# Patient Record
Sex: Male | Born: 2000 | Race: White | Hispanic: No | Marital: Single | Smoking: Never smoker
Health system: Southern US, Community
[De-identification: ages and names within clinical notes are randomized; demographics above are authoritative.]

## PROBLEM LIST (undated history)

## (undated) DIAGNOSIS — F909 Attention-deficit hyperactivity disorder, unspecified type: Secondary | ICD-10-CM

## (undated) DIAGNOSIS — T07XXXA Unspecified multiple injuries, initial encounter: Secondary | ICD-10-CM

## (undated) DIAGNOSIS — S82209A Unspecified fracture of shaft of unspecified tibia, initial encounter for closed fracture: Secondary | ICD-10-CM

---
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
## 2022-05-30 DIAGNOSIS — L0501 Pilonidal cyst with abscess: Secondary | ICD-10-CM | POA: Diagnosis not present

## 2022-06-24 DIAGNOSIS — F4322 Adjustment disorder with anxiety: Secondary | ICD-10-CM | POA: Diagnosis not present

## 2022-07-10 DIAGNOSIS — F4322 Adjustment disorder with anxiety: Secondary | ICD-10-CM | POA: Diagnosis not present

## 2022-07-17 DIAGNOSIS — F4322 Adjustment disorder with anxiety: Secondary | ICD-10-CM | POA: Diagnosis not present

## 2022-10-15 ENCOUNTER — Encounter: Payer: Self-pay | Admitting: Psychiatry

## 2022-10-15 ENCOUNTER — Ambulatory Visit (INDEPENDENT_AMBULATORY_CARE_PROVIDER_SITE_OTHER): Payer: BC Managed Care – PPO | Admitting: Psychiatry

## 2022-10-15 VITALS — BP 116/69 | HR 63 | Ht 66.0 in | Wt 171.0 lb

## 2022-10-15 DIAGNOSIS — F900 Attention-deficit hyperactivity disorder, predominantly inattentive type: Secondary | ICD-10-CM

## 2022-10-15 MED ORDER — AMPHETAMINE-DEXTROAMPHET ER 10 MG PO CP24: 10.0000 mg | ORAL_CAPSULE | Freq: Every day | ORAL | 0 refills | Status: DC
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
## 2022-10-16 ENCOUNTER — Ambulatory Visit: Payer: BC Managed Care – PPO | Admitting: Psychiatry

## 2022-11-17 ENCOUNTER — Ambulatory Visit (INDEPENDENT_AMBULATORY_CARE_PROVIDER_SITE_OTHER): Payer: BC Managed Care – PPO | Admitting: Psychiatry

## 2022-11-17 ENCOUNTER — Encounter: Payer: Self-pay | Admitting: Psychiatry

## 2022-11-17 DIAGNOSIS — F900 Attention-deficit hyperactivity disorder, predominantly inattentive type: Secondary | ICD-10-CM | POA: Diagnosis not present

## 2022-11-17 MED ORDER — AMPHETAMINE-DEXTROAMPHET ER 10 MG PO CP24: 10.0000 mg | ORAL_CAPSULE | ORAL | 0 refills | Status: DC

## 2022-11-17 NOTE — Progress Notes (Signed)
Walter Fox, Walter Fox   Follow-up visit  Date of Service: 11/17/2022  CC/Purpose: Routine medication management follow up.    Walter Fox is a 22 y.o. male with a past psychiatric history of ADHD who presents today for a psychiatric follow up appointment.     The patient was last seen on 10/15/22, at which time the following plan was established:  Medication management:             - Start Adderall XR '10mg'$  daily for ADHD _______________________________________________________________________________________ Acute events/encounters since last visit: denies    Walter Fox presents alone for his appointment. On evaluation he reports that things have been okay since his last visit. He has been taking the Adderall fairly regularly. He feels that the current dose seems to be helping - he notices this most when he misses the dose. His appetite remains fairly stable despite taking this, no other side effects.  He would like to stay on the current dose  He does report some possible depression. He feels down at times, finds his mood is easily impacted by things around him. He still gets interested and excited about things, but this appears diminished some as well. He has low energy, deals with a lot of financial and some relationship stress due to the upcoming wedding. He denies any SI/HI/AVH. He would like to hold off on starting any medicine for his mood.    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   There were no vitals taken for this visit.There is no height or weight on file to calculate BMI.  General Appearance: Neat and Well Groomed  Eye Contact:  Good  Speech:  Clear and Coherent  Mood:  Euthymic  Affect:  Appropriate  Thought Process:  Goal Directed  Orientation:  Full (Time, Place, and Person)  Thought Content:  Logical  Suicidal Thoughts:  No  Homicidal Thoughts:  No  Memory:  Immediate;   Fair  Judgement:  Fair  Insight:  Good  Psychomotor Activity:  Normal  Concentration:  Concentration: Good  Recall:  Good  Fund of Knowledge:  Good  Language:  Good  Assets:  Communication Skills Desire for Improvement Financial Resources/Insurance Housing Intimacy Leisure Time Physical Health Resilience Social Support Talents/Skills Transportation Vocational/Educational  Cognition:  WNL      Assessment   Psychiatric Diagnoses:   ICD-10-CM   1. Attention deficit hyperactivity disorder (ADHD), predominantly inattentive type  F90.0     Monitor for depression  Patient complexity: Moderate   Patient Education and Counseling:  Supportive therapy provided for identified psychosocial stressors.  Medication education provided and decisions regarding medication regimen discussed with  patient/guardian.   On assessment today, Walter Fox reports that the Adderall has provided some benefit to his ADHD symptoms. He has minimal side effects and feels comfortable staying on this dose for now. He reports some mild depressive  symptoms. He is in therapy, and doesn't want to start a medicine at this time. No SI/HI/AVH.    Plan  Medication management:  - Continue Adderall XR '10mg'$  daily for ADHD  Labs/Studies:  - Reviewed  Additional recommendations:  - Continue with current therapist, Crisis plan reviewed and patient verbally contracts for safety. Go to ED with emergent symptoms or safety concerns, and Risks, benefits, side effects of medications, including any / all black box warnings, discussed with patient, who verbalizes their understanding   Follow Up: Return in 1-2 months - Call in the interim for any side-effects, decompensation, questions, or problems between now and the next visit.   I have spent 30 minutes reviewing the patients chart, meeting with the patient and family, and reviewing medicines and side effects.   Acquanetta Belling, MD [facility]

## 2023-01-07 ENCOUNTER — Encounter: Payer: Self-pay | Admitting: Psychiatry

## 2023-01-07 ENCOUNTER — Ambulatory Visit (INDEPENDENT_AMBULATORY_CARE_PROVIDER_SITE_OTHER): Payer: BC Managed Care – PPO | Admitting: Psychiatry

## 2023-01-07 DIAGNOSIS — F439 Reaction to severe stress, unspecified: Secondary | ICD-10-CM

## 2023-01-07 DIAGNOSIS — F900 Attention-deficit hyperactivity disorder, predominantly inattentive type: Secondary | ICD-10-CM | POA: Diagnosis not present

## 2023-01-07 MED ORDER — AMPHETAMINE-DEXTROAMPHET ER 15 MG PO CP24: 15.0000 mg | ORAL_CAPSULE | ORAL | 0 refills | Status: DC

## 2023-01-07 NOTE — Progress Notes (Signed)
[facility] [address], [place]   Follow-up visit  Date of Service: 01/07/2023  CC/Purpose: Routine medication management follow up.    Walter Fox is a 22 y.o. male with a past psychiatric history of ADHD who presents today for a psychiatric follow up appointment.     The patient was last seen on 11/17/22, at which time the following plan was established:  Medication management:             - Continue Adderall XR  daily for ADHD _______________________________________________________________________________________ Acute events/encounters since last visit: denies    Walter Fox presents alone for his appointment. He has been dealing with some stress lately. He works for [employer], and had 3/5 people who work with him let go. So far he still has his job, but this has put him and everyone else at the company on edge. He thinks that he is good for another year with his job now. He is getting married in about 2 weeks, so the stress from this event has been building up as well. He is in therapy and couples therapy. He finds both of these helpful. He notices that he gets very stressed at times when he has responsibilities still. He does wonder about a higher dose of Adderall.  A therapist told him he has some trauma related symptoms, which he agrees with. He reacts strongly to being yelled at or when in arguments. No SI/HI/Avh.    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   There were no vitals taken for this visit.There is no height or weight on file to calculate BMI.  General Appearance: Neat and Well Groomed  Eye Contact:  Good  Speech:  Clear and Coherent  Mood:  Euthymic  Affect:  Appropriate  Thought Process:  Goal Directed  Orientation:  Full (Time, Place, and Person)  Thought Content:  Logical  Suicidal Thoughts:  No  Homicidal Thoughts:  No  Memory:  Immediate;   Fair  Judgement:  Fair  Insight:  Good  Psychomotor Activity:  Normal  Concentration:  Concentration: Good  Recall:  Good  Fund of Knowledge:  Good  Language:  Good  Assets:  Communication Skills Desire for Improvement Financial Resources/Insurance Housing Intimacy Leisure Time Physical Health Resilience Social Support Talents/Skills Transportation Vocational/Educational  Cognition:  WNL      Assessment   Psychiatric Diagnoses:   ICD-10-CM   1. Attention deficit hyperactivity disorder (ADHD), predominantly inattentive type  F90.0     2. Trauma and stressor-related disorder  F43.9     Monitor for depression  Patient complexity: Moderate   Patient Education and Counseling:  Supportive therapy provided for  identified psychosocial stressors.  Medication education provided and decisions regarding medication regimen discussed with patient/guardian.   On assessment today, Walter Fox reports increased stress from work and an upcoming marriage. Despite this he appears to be doing fairly well and has remained in therapy. We will try a higher dose  of his Adderall for now. No SI/HI/AVH.   Plan  Medication management:  - Increase Adderall XR to  daily for ADHD  Labs/Studies:  - Reviewed  Additional recommendations:  - Continue with current therapist, Crisis plan reviewed and patient verbally contracts for safety. Go to ED with emergent symptoms or safety concerns, and Risks, benefits, side effects of medications, including any / all black box warnings, discussed with patient, who verbalizes their understanding   Follow Up: Return in 2 months - Call in the interim for any side-effects, decompensation, questions, or problems between now and the next visit.   I have spent 30 minutes reviewing the patients chart, meeting with the patient and family, and reviewing medicines and side effects.   [name], MD [facility]

## 2023-03-11 ENCOUNTER — Ambulatory Visit: Payer: BC Managed Care – PPO | Admitting: Psychiatry

## 2023-04-18 ENCOUNTER — Other Ambulatory Visit: Payer: Self-pay

## 2023-04-18 ENCOUNTER — Emergency Department (HOSPITAL_BASED_OUTPATIENT_CLINIC_OR_DEPARTMENT_OTHER)
Admission: EM | Admit: 2023-04-18 | Discharge: 2023-04-18 | Disposition: A | Discharge status: Home / Self Care | Payer: No Typology Code available for payment source | Attending: Emergency Medicine | Admitting: Emergency Medicine

## 2023-04-18 ENCOUNTER — Encounter (HOSPITAL_BASED_OUTPATIENT_CLINIC_OR_DEPARTMENT_OTHER): Payer: Self-pay

## 2023-04-18 DIAGNOSIS — S60861A Insect bite (nonvenomous) of right wrist, initial encounter: Secondary | ICD-10-CM | POA: Diagnosis not present

## 2023-04-18 DIAGNOSIS — L03113 Cellulitis of right upper limb: Secondary | ICD-10-CM | POA: Diagnosis not present

## 2023-04-18 DIAGNOSIS — W57XXXA Bitten or stung by nonvenomous insect and other nonvenomous arthropods, initial encounter: Secondary | ICD-10-CM

## 2023-04-18 DIAGNOSIS — S6991XA Unspecified injury of right wrist, hand and finger(s), initial encounter: Secondary | ICD-10-CM | POA: Diagnosis present

## 2023-04-18 MED ORDER — DOXYCYCLINE HYCLATE 100 MG PO TABS
100.0000 mg | ORAL_TABLET | Freq: Once | ORAL | Status: AC
  Administered 2023-04-18: 100 mg via ORAL
  Filled 2023-04-18: qty 1

## 2023-04-18 MED ORDER — DIPHENHYDRAMINE HCL 25 MG PO CAPS
25.0000 mg | ORAL_CAPSULE | Freq: Once | ORAL | Status: AC
  Administered 2023-04-18: 25 mg via ORAL
  Filled 2023-04-18: qty 1

## 2023-04-18 MED ORDER — DOXYCYCLINE HYCLATE 100 MG PO CAPS: 100.0000 mg | ORAL_CAPSULE | Freq: Two times a day (BID) | ORAL | 0 refills | Status: AC

## 2023-04-18 MED ORDER — DOXYCYCLINE HYCLATE 100 MG PO CAPS: 100.0000 mg | ORAL_CAPSULE | Freq: Two times a day (BID) | ORAL | 0 refills | Status: DC

## 2023-04-18 NOTE — Discharge Instructions (Addendum)
Watch for signs of spreading cellulitis despite oral antibiotic use.  If redness is not improving despite oral antibiotics after a day or 2, return for repeat evaluation as you may need IV antibiotics.  Recommend warm compresses, elevation of the extremity, Tylenol and ibuprofen for pain control.

## 2023-04-18 NOTE — ED Provider Notes (Signed)
[place] EMERGENCY DEPARTMENT AT [facility] Provider Note   CSN: [id number] Arrival date & time: 04/18/23  1931     History  Chief Complaint  Patient presents with   Insect Bite    Walter Fox is a 22 y.o. male.  HPI   22 year old male presenting to the emergency department with a chief complaint of redness and pain after an insect bite.  The patient states that he thought his right wrist was bit by an insect yesterday.  He noticed some itching and mild swelling and then today developed surrounding erythema that is tracking up his right arm.  He denies any exposure to rose thorns or pedals.  He denies any known tick exposure.  No fevers or chills.  He was seen at urgent care and was referred to the emergency department for further evaluation.  Home Medications Prior to Admission medications   Medication Sig Start Date End Date Taking? Authorizing Provider  acetaminophen (TYLENOL) 325 MG tablet Take 650 mg by mouth every 6 (six) hours as needed.    [provider]  amphetamine-dextroamphetamine (ADDERALL XR) 15 MG 24 hr capsule Take 1 capsule by mouth every morning. 01/07/23   [name], MD  amphetamine-dextroamphetamine (ADDERALL XR) 15 MG 24 hr capsule Take 1 capsule by mouth every morning. 02/04/23   [name], MD      Allergies    Omnicef [cefdinir]    Review of Systems   Review of Systems  All other systems reviewed and are negative.   Physical Exam Updated Vital Signs BP 134/87   Pulse 100   Temp 98.9 F (37.2 C)   Resp 16   Ht 5\' 6"  (1.676 m)   Wt 74.8 kg   SpO2 97%   BMI 26.63 kg/m  Physical Exam Vitals and nursing note reviewed.  Constitutional:      General: He is not in acute distress. HENT:     Head: Normocephalic and atraumatic.  Eyes:     Conjunctiva/sclera: Conjunctivae normal.     Pupils: Pupils are equal, round, and reactive to light.  Cardiovascular:     Rate and Rhythm: Normal rate and regular rhythm.   Pulmonary:     Effort: Pulmonary effort is normal. No respiratory distress.  Abdominal:     General: There is no distension.     Tenderness: There is no guarding.  Musculoskeletal:        General: No deformity or signs of injury.     Cervical back: Neck supple.     Comments: Erythema of the right wrist, intact distal pulses, no fluctuance, no crepitus.  Erythema tracks up the right forearm.  Skin:    Findings: Erythema present. No lesion or rash.  Neurological:     General: No focal deficit present.     Mental Status: He is alert. Mental status is at baseline.     ED Results / Procedures / Treatments   Labs (all labs ordered are listed, but only abnormal results are displayed) Labs Reviewed - No data to display  EKG None  Radiology No results found.  Procedures Procedures    Medications Ordered in ED Medications  diphenhydrAMINE (BENADRYL) capsule 25 mg (25 mg Oral Given 04/18/23 2039)  doxycycline (VIBRA-TABS) tablet 100 mg (100 mg Oral Given 04/18/23 2039)    ED Course/ Medical Decision Making/ A&P  Medical Decision Making Risk Prescription drug management.    22 year old male presenting to the emergency department with a chief complaint of redness and pain after an insect bite.  The patient states that he thought his right wrist was bit by an insect yesterday.  He noticed some itching and mild swelling and then today developed surrounding erythema that is tracking up his right arm.  He denies any exposure to rose thorns or pedals.  He denies any known tick exposure.  No fevers or chills.  He was seen at urgent care and was referred to the emergency department for further evaluation.  Arrival, the patient was afebrile, hemodynamically stable, saturating well on room air.  Physical exam significant for cellulitis of the right wrist with no fluctuance to suggest abscess, no crepitus or significant tenderness.  Some rapid progression however  the patient is overall hemodynamically stable and clinically well-appearing.  Suspect likely cellulitis after an insect bite.  Will treat with doxycycline the patient has an allergy to [facility].  Provided strict return precautions, advised Tylenol and ibuprofen for pain control and fever control.  Final Clinical Impression(s) / ED Diagnoses Final diagnoses:  Cellulitis of right upper extremity  Insect bite of right wrist, initial encounter    Rx / DC Orders ED Discharge Orders     None         Ernie Avena, MD 04/18/23 2053

## 2023-04-18 NOTE — ED Triage Notes (Signed)
Pt states he believes he was bitten by a spider, swelling noted to right wrist, redness trailing up right arm, worsened in the last 30 min.

## 2023-05-04 ENCOUNTER — Other Ambulatory Visit: Payer: Self-pay

## 2023-05-04 ENCOUNTER — Telehealth: Payer: Self-pay | Admitting: Psychiatry

## 2023-05-04 MED ORDER — AMPHETAMINE-DEXTROAMPHET ER 15 MG PO CP24: 15.0000 mg | ORAL_CAPSULE | ORAL | 0 refills | Status: DC

## 2023-05-04 NOTE — Telephone Encounter (Signed)
Walter Fox called at 11:20 to request refill of his Adderall 15mg .  Made appt for 8/14.  Send to [id number] - [place], [place] - [address] AT [address] [facility] [address]

## 2023-05-04 NOTE — Telephone Encounter (Signed)
Pended.

## 2023-05-06 ENCOUNTER — Ambulatory Visit (INDEPENDENT_AMBULATORY_CARE_PROVIDER_SITE_OTHER): Payer: Self-pay | Admitting: Psychiatry

## 2023-05-06 DIAGNOSIS — F900 Attention-deficit hyperactivity disorder, predominantly inattentive type: Secondary | ICD-10-CM

## 2023-05-08 NOTE — Progress Notes (Signed)
No show

## 2023-06-17 ENCOUNTER — Encounter: Payer: Self-pay | Admitting: Psychiatry

## 2023-06-17 ENCOUNTER — Ambulatory Visit (INDEPENDENT_AMBULATORY_CARE_PROVIDER_SITE_OTHER): Payer: No Typology Code available for payment source | Admitting: Psychiatry

## 2023-06-17 DIAGNOSIS — F321 Major depressive disorder, single episode, moderate: Secondary | ICD-10-CM | POA: Diagnosis not present

## 2023-06-17 DIAGNOSIS — F439 Reaction to severe stress, unspecified: Secondary | ICD-10-CM | POA: Diagnosis not present

## 2023-06-17 DIAGNOSIS — F900 Attention-deficit hyperactivity disorder, predominantly inattentive type: Secondary | ICD-10-CM | POA: Diagnosis not present

## 2023-06-17 MED ORDER — AMPHETAMINE-DEXTROAMPHET ER 15 MG PO CP24: 15.0000 mg | ORAL_CAPSULE | ORAL | 0 refills | Status: DC

## 2023-06-17 MED ORDER — AMPHETAMINE-DEXTROAMPHETAMINE 5 MG PO TABS: 5.0000 mg | ORAL_TABLET | Freq: Every day | ORAL | 0 refills | Status: DC

## 2023-06-17 MED ORDER — FLUOXETINE HCL 20 MG PO CAPS: 20.0000 mg | ORAL_CAPSULE | Freq: Every day | ORAL | 1 refills | Status: DC

## 2023-06-17 NOTE — Progress Notes (Signed)
[facility] [address], [place]   Follow-up visit  Date of Service: 06/17/2023  CC/Purpose: Routine medication management follow up.    Walter Fox is a 22 y.o. male with a past psychiatric history of ADHD who presents today for a psychiatric follow up appointment.     The patient was last seen on 01/07/23, at which time the following plan was established:  Medication management:             - Increase Adderall XR to 15mg  daily for ADHD _______________________________________________________________________________________ Acute events/encounters since last visit: denies    Walter Fox presents alone for his appointment. He reports that things have been going okay in some ways. He is now working at the [employer] in [employer]. He feels the Adderall is helping tremendously. He does notice it wears off at a certain point, however. He feels irritable as it wears off, also finds low motivation to do things. He reports some difficulty with his mood. He feels low moods often, low motivation, doesn't look forward to things as much, low energy. He has been in therapy for a while. He is interested in trying a medicine to help with his mood. Provided supportive therapy. No SI/HI/Avh.    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   There were no vitals taken for this visit.There is no height or weight on file to calculate BMI.  General Appearance: Neat and Well Groomed  Eye Contact:  Good  Speech:  Clear and Coherent  Mood:  Euthymic  Affect:  Appropriate  Thought Process:  Goal Directed  Orientation:  Full (Time, Place, and Person)  Thought Content:  Logical  Suicidal Thoughts:  No  Homicidal Thoughts:  No  Memory:  Immediate;   Fair  Judgement:  Fair  Insight:  Good  Psychomotor Activity:  Normal  Concentration:  Concentration: Good  Recall:  Good  Fund of Knowledge:  Good  Language:  Good  Assets:  Communication Skills Desire for Improvement Financial Resources/Insurance Housing Intimacy Leisure Time Physical Health Resilience Social Support Talents/Skills Transportation Vocational/Educational  Cognition:  WNL      Assessment   Psychiatric Diagnoses:   ICD-10-CM   1. Attention deficit hyperactivity disorder (ADHD), predominantly inattentive type  F90.0     2. 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 MDD (major depressive disorder), single episode, moderate (HCC)  F32.1      Patient complexity: Moderate   Patient Education and Counseling:  Supportive therapy provided for identified psychosocial stressors.  Medication education provided and decisions regarding medication regimen discussed with patient/guardian.   On assessment today, Walter Fox has tolerated the higher dose of Adderall. This appears  effective so we will add a booster dose for after work. He does report symptoms consistent with depression, including low interest in things, low motivation, low moods, low feelings of self worth at times. Provided supportive  therapy. No SI/HI/AVH.   Plan  Medication management:  - Adderall XR 15mg  daily for ADHD  - Adderall IR 5mg  in the afternoon  - Start Prozac 20mg  daily for depression.  Labs/Studies:  - Reviewed  Additional recommendations:  - Continue with current therapist, Crisis plan reviewed and patient verbally contracts for safety. Go to ED with emergent symptoms or safety concerns, and Risks, benefits, side effects of medications, including any / all black box warnings, discussed with patient, who verbalizes their understanding   Follow Up: Return in 1 month - Call in the interim for any side-effects, decompensation, questions, or problems between now and the next visit.   I have spent 40 minutes reviewing the patients chart, meeting with the patient and family, and reviewing medicines and side effects.  I spent 16 minutes providing supportive therapy, including empathic validation, praise, normalizing, discussing interpersonal communication skills, psychoeducation to both the child and their guardian for their current social and familial stressors.   [name], MD [facility]

## 2023-06-22 ENCOUNTER — Telehealth: Payer: Self-pay | Admitting: Psychiatry

## 2023-06-22 NOTE — Telephone Encounter (Signed)
Patient reporting unable to find Adderall dose.  Told him to try [facility], [facility] and [facility]. Told him I don't know that they have in stock, but that we can usually find it with these pharmacies.

## 2023-06-22 NOTE — Telephone Encounter (Signed)
Pt Lvm @ 10:21a.  He said he had a quick question about his medication from Dr. Stevphen Rochester.  Next appt 10/23

## 2023-06-22 NOTE — Telephone Encounter (Signed)
LVM to RC 

## 2023-07-15 ENCOUNTER — Encounter: Payer: Self-pay | Admitting: Psychiatry

## 2023-07-15 ENCOUNTER — Ambulatory Visit: Payer: No Typology Code available for payment source | Admitting: Psychiatry

## 2023-07-15 DIAGNOSIS — F439 Reaction to severe stress, unspecified: Secondary | ICD-10-CM

## 2023-07-15 DIAGNOSIS — F900 Attention-deficit hyperactivity disorder, predominantly inattentive type: Secondary | ICD-10-CM | POA: Diagnosis not present

## 2023-07-15 DIAGNOSIS — F321 Major depressive disorder, single episode, moderate: Secondary | ICD-10-CM | POA: Diagnosis not present

## 2023-07-15 MED ORDER — FLUOXETINE HCL 20 MG PO CAPS: 20.0000 mg | ORAL_CAPSULE | Freq: Every day | ORAL | 1 refills | Status: DC

## 2023-07-15 MED ORDER — AMPHETAMINE-DEXTROAMPHETAMINE 5 MG PO TABS: 5.0000 mg | ORAL_TABLET | Freq: Every day | ORAL | 0 refills | Status: DC

## 2023-07-15 NOTE — Progress Notes (Signed)
[facility] [address], [place]   Follow-up visit  Date of Service: 07/15/2023  CC/Purpose: Routine medication management follow up.    Walter Fox is a 22 y.o. male with a past psychiatric history of ADHD who presents today for a psychiatric follow up appointment.     The patient was last seen on 06/17/23, at which time the following plan was established: Medication management:             - Adderall XR 15mg  daily for ADHD             - Adderall IR 5mg  in the afternoon             - Start Prozac 20mg  daily for depression. _______________________________________________________________________________________ Acute events/encounters since last visit: denies    Walter Fox presents alone for his appointment. He has been taking prozac for about 3 weeks. So far he notices some potential benefit, he feels that there have been fewer bad days. He does notice a new tremor that has started recently. He also feels that sometimes he has some trouble at night. He wonders if taking it earlier and eating more often at work would help these things. He would like to give the medicine more time. No SI/HI/Avh.    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   There were no vitals taken for this visit.There is no height or weight on file to calculate BMI.  General Appearance: Neat and Well Groomed  Eye Contact:  Good  Speech:  Clear and Coherent  Mood:  Euthymic  Affect:  Appropriate  Thought Process:  Goal Directed  Orientation:  Full (Time, Place, and Person)  Thought Content:  Logical  Suicidal Thoughts:  No  Homicidal Thoughts:  No  Memory:  Immediate;   Fair  Judgement:  Fair  Insight:  Good  Psychomotor Activity:  Normal  Concentration:  Concentration: Good  Recall:  Good  Fund of Knowledge:  Good  Language:  Good  Assets:  Communication Skills Desire for Improvement Financial Resources/Insurance Housing Intimacy Leisure Time Physical Health Resilience Social Support Talents/Skills Transportation Vocational/Educational  Cognition:  WNL      Assessment   Psychiatric Diagnoses:   ICD-10-CM   1. Attention deficit hyperactivity disorder (ADHD), predominantly inattentive type  F90.0     2. 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 MDD (major depressive disorder), single episode, moderate (HCC)  F32.1       Patient complexity: Moderate   Patient Education and Counseling:  Supportive therapy provided for identified psychosocial stressors.  Medication education provided and decisions regarding medication regimen discussed with patient/guardian.   On assessment today, Walter Fox has shown slight response to Prozac. His depression does appear a bit improved. We will  continue with this and monitor his tremor and sleep. No SI/HI/AVH.   Plan  Medication management:  - Adderall XR 15mg  daily for ADHD  - Adderall IR 5mg  in the afternoon  - Prozac 20mg  daily for depression.  Labs/Studies:  - Reviewed  Additional recommendations:  - Continue with current therapist, Crisis plan reviewed and patient verbally contracts for safety. Go to ED  with emergent symptoms or safety concerns, and Risks, benefits, side effects of medications, including any / all black box warnings, discussed with patient, who verbalizes their understanding   Follow Up: Return in 1 month - Call in the interim for any side-effects, decompensation, questions, or problems between now and the next visit.   I have spent 25 minutes reviewing the patients chart, meeting with the patient and family, and reviewing medicines and side effects.   [name], MD [facility]

## 2023-08-26 ENCOUNTER — Ambulatory Visit: Payer: No Typology Code available for payment source | Admitting: Psychiatry

## 2023-10-13 ENCOUNTER — Other Ambulatory Visit: Payer: Self-pay | Admitting: Psychiatry

## 2023-10-15 ENCOUNTER — Other Ambulatory Visit: Payer: Self-pay

## 2023-10-15 ENCOUNTER — Telehealth: Payer: Self-pay | Admitting: Psychiatry

## 2023-10-15 MED ORDER — AMPHETAMINE-DEXTROAMPHET ER 15 MG PO CP24: 15.0000 mg | ORAL_CAPSULE | ORAL | 0 refills | Status: DC

## 2023-10-15 NOTE — Telephone Encounter (Signed)
Pt rs appt to February. He needs a refill on his adderall xr 15 mg. [facility] [address]

## 2023-10-15 NOTE — Telephone Encounter (Signed)
Pended [facility]derall [facility]R 15 to [facility] [address].

## 2023-10-16 ENCOUNTER — Ambulatory Visit: Payer: No Typology Code available for payment source | Admitting: Psychiatry

## 2023-11-05 ENCOUNTER — Ambulatory Visit: Payer: No Typology Code available for payment source | Admitting: Psychiatry

## 2023-11-05 ENCOUNTER — Telehealth: Payer: Self-pay | Admitting: Psychiatry

## 2023-11-05 ENCOUNTER — Encounter: Payer: Self-pay | Admitting: Psychiatry

## 2023-11-05 DIAGNOSIS — F900 Attention-deficit hyperactivity disorder, predominantly inattentive type: Secondary | ICD-10-CM

## 2023-11-05 DIAGNOSIS — F321 Major depressive disorder, single episode, moderate: Secondary | ICD-10-CM | POA: Diagnosis not present

## 2023-11-05 DIAGNOSIS — F439 Reaction to severe stress, unspecified: Secondary | ICD-10-CM | POA: Diagnosis not present

## 2023-11-05 MED ORDER — AMPHETAMINE-DEXTROAMPHET ER 20 MG PO CP24: 20.0000 mg | ORAL_CAPSULE | ORAL | 0 refills | Status: DC

## 2023-11-05 MED ORDER — FLUOXETINE HCL 40 MG PO CAPS: 40.0000 mg | ORAL_CAPSULE | Freq: Every day | ORAL | 1 refills | Status: DC

## 2023-11-05 NOTE — Telephone Encounter (Signed)
RF was sent at his appt today.

## 2023-11-05 NOTE — Progress Notes (Signed)
[facility] [address], [place]   Follow-up visit  Date of Service: 11/05/2023  CC/Purpose: Routine medication management follow up.    Walter Fox is a 23 y.o. male with a past psychiatric history of ADHD who presents today for a psychiatric follow up appointment.     The patient was last seen on 07/15/23, at which time the following plan was established: Medication management:             - Adderall XR 15mg  daily for ADHD             - Adderall IR 5mg  in the afternoon             - Prozac 20mg  daily for depression. _______________________________________________________________________________________ Acute events/encounters since last visit: denies    Walter Fox presents alone for his appointment. He states that he has been taking his medicine still. He feels that the Prozac has helped with his anxiety some, but he still struggles quite a bit with his mood. He reports feeling down and sad often still. Discussed trying a higher dose and he is okay with this. He notes that he has a slight tremor, which he feels may be from the medicine. No SI/HI/AVH.  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   There were no vitals taken for this visit.There is no height or weight on file to calculate BMI.  General Appearance: Neat and Well Groomed  Eye Contact:  Good  Speech:  Clear and Coherent  Mood:  Euthymic  Affect:  Appropriate  Thought Process:  Goal Directed  Orientation:  Full (Time, Place, and Person)  Thought Content:  Logical  Suicidal Thoughts:  No  Homicidal Thoughts:  No  Memory:  Immediate;   Fair  Judgement:  Fair  Insight:  Good  Psychomotor Activity:  Normal  Concentration:  Concentration: Good  Recall:  Good  Fund of Knowledge:  Good  Language:  Good  Assets:  Communication Skills Desire for Improvement Financial Resources/Insurance Housing Intimacy Leisure Time Physical Health Resilience Social Support Talents/Skills Transportation Vocational/Educational  Cognition:  WNL      Assessment   Psychiatric Diagnoses:   ICD-10-CM   1. Attention deficit hyperactivity disorder (ADHD), predominantly inattentive type  F90.0     2. MDD (major depressive disorder), single episode, moderate (HCC)  F32.1     3. Trauma and stressor-related disorder  F43.9        Patient complexity: Moderate   Patient Education and Counseling:  Supportive therapy provided for identified psychosocial stressors.  Medication education provided and decisions regarding medication regimen discussed with patient/guardian.   On assessment today, Walter Fox has shown slight response to Prozac. His depression does remain to a degree, so we will raise his dose as below. He  has a slight tremor that we will continue to monitor. No SI/HI/AVH.   Plan  Medication management:  - Increase Adderall XR to 20mg  daily for ADHD  - Adderall IR 5mg  in the afternoon  - Increase Prozac to 40mg  daily for depression.  Labs/Studies:  - Reviewed  Additional recommendations:  - Continue with current therapist, Crisis plan reviewed and patient verbally contracts for safety.  Go to ED with emergent symptoms or safety concerns, and Risks, benefits, side effects of medications, including any / all black box warnings, discussed with patient, who verbalizes their understanding   Follow Up: Return in 1 month - Call in the interim for any side-effects, decompensation, questions, or problems between now and the next visit.   I have spent 25 minutes reviewing the patients chart, meeting with the patient and family, and reviewing medicines and side effects.   [name], MD [facility]

## 2023-11-05 NOTE — Telephone Encounter (Signed)
Next visit is 01/12/24. Requesting refill on Fluoxetine 40 mg called to [facility], [address] [place] , [place], [place]. Phone number is [phone]

## 2023-11-16 ENCOUNTER — Ambulatory Visit: Payer: Self-pay | Admitting: Surgery

## 2023-12-27 ENCOUNTER — Other Ambulatory Visit: Payer: Self-pay | Admitting: Psychiatry

## 2024-01-07 ENCOUNTER — Other Ambulatory Visit: Payer: Self-pay

## 2024-01-07 ENCOUNTER — Telehealth: Payer: Self-pay | Admitting: Psychiatry

## 2024-01-07 MED ORDER — AMPHETAMINE-DEXTROAMPHET ER 20 MG PO CP24: 20.0000 mg | ORAL_CAPSULE | ORAL | 0 refills | Status: DC

## 2024-01-07 NOTE — Telephone Encounter (Signed)
 Pt called asking for a refill on his adderall xr 20 mg. [facility] [address] Next appt 02/11/24

## 2024-01-07 NOTE — Telephone Encounter (Signed)
 Pended Adderall XR 20

## 2024-02-11 ENCOUNTER — Ambulatory Visit (INDEPENDENT_AMBULATORY_CARE_PROVIDER_SITE_OTHER): Admitting: Psychiatry

## 2024-02-11 ENCOUNTER — Encounter: Payer: Self-pay | Admitting: Psychiatry

## 2024-02-11 DIAGNOSIS — F321 Major depressive disorder, single episode, moderate: Secondary | ICD-10-CM | POA: Diagnosis not present

## 2024-02-11 DIAGNOSIS — F900 Attention-deficit hyperactivity disorder, predominantly inattentive type: Secondary | ICD-10-CM

## 2024-02-11 DIAGNOSIS — F439 Reaction to severe stress, unspecified: Secondary | ICD-10-CM | POA: Diagnosis not present

## 2024-02-11 MED ORDER — AMPHETAMINE-DEXTROAMPHET ER 20 MG PO CP24
20.0000 mg | ORAL_CAPSULE | Freq: Every day | ORAL | 0 refills | Status: AC
Start: 2024-03-10 — End: ?

## 2024-02-11 MED ORDER — AMPHETAMINE-DEXTROAMPHETAMINE 10 MG PO TABS: 10.0000 mg | ORAL_TABLET | Freq: Two times a day (BID) | ORAL | 0 refills | Status: AC

## 2024-02-11 MED ORDER — AMPHETAMINE-DEXTROAMPHET ER 20 MG PO CP24: 20.0000 mg | ORAL_CAPSULE | ORAL | 0 refills | Status: AC

## 2024-02-11 MED ORDER — FLUOXETINE HCL 40 MG PO CAPS: 40.0000 mg | ORAL_CAPSULE | Freq: Every day | ORAL | 1 refills | Status: AC

## 2024-02-11 NOTE — Progress Notes (Signed)
 [facility] [address], [place]   Follow-up visit  Date of Service: 02/11/2024  CC/Purpose: Routine medication management follow up.    Walter Fox is a 23 y.o. male with a past psychiatric history of ADHD who presents today for a psychiatric follow up appointment.     The patient was last seen on 11/05/23, at which time the following plan was established: Medication management:             - Increase Adderall XR to 20mg  daily for ADHD             - Adderall IR 5mg  in the afternoon             - Increase Prozac  to 40mg  daily for depression. _______________________________________________________________________________________ Acute events/encounters since last visit: denies    Walter Fox presents alone for his appointment. He feels that the higher dose of Prozac  has been helpful for his mood and anxiety. He does still feel that Adderall wears off quickly. Discussed trying a longer acting medicine or adding booster doses. He would prefer to add booster doses. No other concerns today. No SI/HI/AVH.  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   There were no vitals taken  for this visit.There is no height or weight on file to calculate BMI.  General Appearance: Neat and Well Groomed  Eye Contact:  Good  Speech:  Clear and Coherent  Mood:  Euthymic  Affect:  Appropriate  Thought Process:  Goal Directed  Orientation:  Full (Time, Place, and Person)  Thought Content:  Logical  Suicidal Thoughts:  No  Homicidal Thoughts:  No  Memory:  Immediate;   Fair  Judgement:  Fair  Insight:  Good  Psychomotor Activity:  Normal  Concentration:  Concentration: Good  Recall:  Good  Fund of Knowledge:  Good  Language:  Good  Assets:  Communication Skills Desire for Improvement Financial Resources/Insurance Housing Intimacy Leisure Time Physical Health Resilience Social Support Talents/Skills Transportation Vocational/Educational  Cognition:  WNL      Assessment   Psychiatric Diagnoses:   ICD-10-CM   1. Attention deficit hyperactivity disorder (ADHD), predominantly inattentive type  F90.0     2. MDD (major depressive disorder), single episode, moderate (HCC)  F32.1     3. Trauma and stressor-related disorder  F43.9        Patient complexity: Moderate   Patient Education and Counseling:  Supportive therapy provided for identified psychosocial stressors.  Medication education provided and decisions regarding medication regimen discussed with patient/guardian.   On assessment today, [name] has shown slight response to Prozac . His depression does remain to a degree, so we will raise his dose as below. He has a slight tremor that we will continue to monitor. No SI/HI/AVH.   Plan  Medication management:  - Adderall XR 20mg  daily for ADHD  - Increase Adderall IR to 10mg  BID  - Prozac  40mg  daily for depression.  Labs/Studies:  - Reviewed  Additional recommendations:  - Continue with current therapist, Crisis plan reviewed and patient verbally contracts for safety. Go to ED with emergent symptoms or safety concerns, and Risks, benefits, side  effects of medications, including any / all black box warnings, discussed with patient, who verbalizes their understanding   Follow Up: Return in 2 month - Call in the interim for any side-effects, decompensation, questions, or problems between now and the next visit.   I have spent 25 minutes reviewing the patients chart, meeting with the patient and family, and reviewing medicines and side effects.   Anniece Base, MD [facility]

## 2024-04-14 ENCOUNTER — Ambulatory Visit: Admitting: Psychiatry
